# Patient Record
Sex: Female | Born: 2000 | Race: White | Hispanic: No | Marital: Single | Smoking: Never smoker
Health system: Southern US, Community
[De-identification: ages and names within clinical notes are randomized; demographics above are authoritative.]

---
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
## 2020-10-04 ENCOUNTER — Emergency Department (HOSPITAL_COMMUNITY)
Admission: EM | Admit: 2020-10-04 | Discharge: 2020-10-05 | Disposition: A | Discharge status: Home / Self Care | Payer: Medicaid - Out of State | Attending: Emergency Medicine | Admitting: Emergency Medicine

## 2020-10-04 ENCOUNTER — Other Ambulatory Visit: Payer: Self-pay

## 2020-10-04 ENCOUNTER — Encounter (HOSPITAL_COMMUNITY): Payer: Self-pay

## 2020-10-04 ENCOUNTER — Emergency Department (HOSPITAL_COMMUNITY): Payer: Medicaid - Out of State

## 2020-10-04 DIAGNOSIS — R000 Tachycardia, unspecified: Secondary | ICD-10-CM | POA: Insufficient documentation

## 2020-10-04 DIAGNOSIS — U071 COVID-19: Secondary | ICD-10-CM | POA: Insufficient documentation

## 2020-10-04 DIAGNOSIS — R0602 Shortness of breath: Secondary | ICD-10-CM | POA: Diagnosis present

## 2020-10-04 LAB — CBC WITH DIFFERENTIAL/PLATELET
Abs Immature Granulocytes: 0.02 10*3/uL (ref 0.00–0.07)
Basophils Absolute: 0 10*3/uL (ref 0.0–0.1)
Basophils Relative: 0 %
Eosinophils Absolute: 0 10*3/uL (ref 0.0–0.5)
Eosinophils Relative: 1 %
HCT: 40.4 % (ref 36.0–46.0)
Hemoglobin: 13.9 g/dL (ref 12.0–15.0)
Immature Granulocytes: 1 %
Lymphocytes Relative: 6 %
Lymphs Abs: 0.2 10*3/uL — ABNORMAL LOW (ref 0.7–4.0)
MCH: 30.2 pg (ref 26.0–34.0)
MCHC: 34.4 g/dL (ref 30.0–36.0)
MCV: 87.6 fL (ref 80.0–100.0)
Monocytes Absolute: 0.4 10*3/uL (ref 0.1–1.0)
Monocytes Relative: 12 %
Neutro Abs: 2.8 10*3/uL (ref 1.7–7.7)
Neutrophils Relative %: 80 %
Platelets: 208 10*3/uL (ref 150–400)
RBC: 4.61 MIL/uL (ref 3.87–5.11)
RDW: 11.9 % (ref 11.5–15.5)
WBC: 3.4 10*3/uL — ABNORMAL LOW (ref 4.0–10.5)
nRBC: 0 % (ref 0.0–0.2)

## 2020-10-04 LAB — I-STAT BETA HCG BLOOD, ED (MC, WL, AP ONLY): I-stat hCG, quantitative: <5 m[IU]/mL (ref <5)

## 2020-10-04 MED ORDER — ACETAMINOPHEN 325 MG PO TABS
650.0000 mg | ORAL_TABLET | Freq: Once | ORAL | Status: AC | PRN
  Administered 2020-10-04: 650 mg via ORAL
  Filled 2020-10-04: qty 2

## 2020-10-04 MED ORDER — SODIUM CHLORIDE 0.9 % IV BOLUS
1000.0000 mL | Freq: Once | INTRAVENOUS | Status: AC
  Administered 2020-10-04: 1000 mL via INTRAVENOUS

## 2020-10-04 NOTE — ED Provider Notes (Signed)
[facility]-EMERGENCY DEPT Provider Note   CSN: [id number] Arrival date & time: 10/04/20  2128     History Chief Complaint  Patient presents with  . Shortness of Breath    Marcia Steele is a 20 y.o. female.  Patient with history of migraine presents with frontal headache and mild congestion for the past 3 days, onset fever, SOB, and pleuritic chest pain today with minimal cough. No known sick contacts, including live-in boyfriend. No history of asthma. She reports nausea without vomiting. No diarrhea, abdominal pain, or urinary symptoms. She is not COVID vaccinated.   The history is provided by the patient. No language interpreter was used.  Shortness of Breath Associated symptoms: chest pain, cough (Minimal), fever and headaches   Associated symptoms: no abdominal pain, no rash, no sore throat and no vomiting        History reviewed. No pertinent past medical history.  There are no problems to display for this patient.   History reviewed. No pertinent surgical history.   OB History   No obstetric history on file.     No family history on file.  Social History   Tobacco Use  . Smoking status: Never Smoker  . Smokeless tobacco: Never Used  Substance Use Topics  . Alcohol use: Never  . Drug use: Never    Home Medications Prior to Admission medications   Not on File    Allergies    Patient has no known allergies.  Review of Systems   Review of Systems  Constitutional: Positive for chills and fever.  HENT: Positive for congestion. Negative for sore throat.   Respiratory: Positive for cough (Minimal) and shortness of breath.   Cardiovascular: Positive for chest pain. Negative for leg swelling.  Gastrointestinal: Positive for nausea. Negative for abdominal pain, diarrhea and vomiting.  Genitourinary: Positive for decreased urine volume. Negative for dysuria, flank pain and hematuria.  Musculoskeletal: Positive for myalgias.  Skin:  Negative for rash.  Neurological: Positive for headaches. Negative for weakness and light-headedness.    Physical Exam Updated Vital Signs BP 135/85   Pulse (!) 134   Temp (!) 101.5 F (38.6 C) (Oral)   Resp (!) 23   SpO2 98%   Physical Exam Vitals and nursing note reviewed.  Constitutional:      Appearance: She is well-developed and well-nourished.  HENT:     Head: Normocephalic.  Cardiovascular:     Rate and Rhythm: Regular rhythm. Tachycardia present.     Heart sounds: No murmur heard.   Pulmonary:     Effort: Pulmonary effort is normal.     Breath sounds: Normal breath sounds. No wheezing, rhonchi or rales.  Abdominal:     General: Bowel sounds are normal.     Palpations: Abdomen is soft.     Tenderness: There is no abdominal tenderness. There is no guarding or rebound.  Musculoskeletal:        General: No tenderness. Normal range of motion.     Cervical back: Normal range of motion and neck supple.     Right lower leg: No edema.     Left lower leg: No edema.  Skin:    General: Skin is warm and dry.     Findings: No rash.  Neurological:     General: No focal deficit present.     Mental Status: She is alert and oriented to person, place, and time.  Psychiatric:        Mood and Affect: Mood  and affect normal.     ED Results / Procedures / Treatments   Labs (all labs ordered are listed, but only abnormal results are displayed) Labs Reviewed  RESP PANEL BY RT-PCR (FLU A&B, COVID) ARPGX2  CBC WITH DIFFERENTIAL/PLATELET  COMPREHENSIVE METABOLIC PANEL  I-STAT BETA HCG BLOOD, ED ([facility], [facility], [facility] ONLY)    EKG None  Radiology No results found.  Procedures Procedures (including critical care time)  Medications Ordered in ED Medications  sodium chloride 0.9 % bolus 1,000 mL (has no administration in time range)  acetaminophen (TYLENOL) tablet 650 mg (650 mg Oral Given 10/04/20 2142)    ED Course  I have reviewed the triage vital signs and the nursing  notes.  Pertinent labs & imaging results that were available during my care of the patient were reviewed by me and considered in my medical decision making (see chart for details).    MDM Rules/Calculators/A&P                          Patient to ED with ss/sxs as per HPI.   She is in NAD, non-labored breathing. Tachycardic, febrile. Labs, CXR, IVF's pending.   CXR clear. She is COVID positive. Tachycardia felt secondary to fever possible element of dehydration - will recheck after fluids and defervescence.   On recheck, heart rate had improved to 107 with fluids. Albuterol given for mild SOB, with improvement to symptoms and expected reflex tachycardia to 120. BP decreased to 92 systolic. Will give another liter of fluids.   VS improved after fluids. O2 sat 100%. She can be discharged home with strict return precautions. Albuterol inhaler provided for as needed use. Will provide information to [facility] infusion clinic.    Final Clinical Impression(s) / ED Diagnoses Final diagnoses:  None   1. COVID-19  Rx / DC Orders ED Discharge Orders    None       Elpidio Anis, PA-C 10/05/20 0253    Rozelle Logan, DO 10/07/20 1840

## 2020-10-04 NOTE — ED Triage Notes (Signed)
Pt sts shob beginning last night and getting worse today.

## 2020-10-05 LAB — RESP PANEL BY RT-PCR (FLU A&B, COVID) ARPGX2
Influenza A by PCR: NEGATIVE
Influenza B by PCR: NEGATIVE
SARS Coronavirus 2 by RT PCR: POSITIVE — AB

## 2020-10-05 LAB — COMPREHENSIVE METABOLIC PANEL
ALT: 17 U/L (ref 0–44)
AST: 18 U/L (ref 15–41)
Albumin: 3.9 g/dL (ref 3.5–5.0)
Alkaline Phosphatase: 132 U/L — ABNORMAL HIGH (ref 38–126)
Anion gap: 9 (ref 5–15)
BUN: 9 mg/dL (ref 6–20)
CO2: 23 mmol/L (ref 22–32)
Calcium: 9 mg/dL (ref 8.9–10.3)
Chloride: 107 mmol/L (ref 98–111)
Creatinine, Ser: 0.91 mg/dL (ref 0.44–1.00)
GFR, Estimated: >60 mL/min (ref >60)
Glucose, Bld: 129 mg/dL — ABNORMAL HIGH (ref 70–99)
Potassium: 3.8 mmol/L (ref 3.5–5.1)
Sodium: 139 mmol/L (ref 135–145)
Total Bilirubin: 0.4 mg/dL (ref 0.3–1.2)
Total Protein: 6.7 g/dL (ref 6.5–8.1)

## 2020-10-05 MED ORDER — SODIUM CHLORIDE 0.9 % IV BOLUS
1000.0000 mL | Freq: Once | INTRAVENOUS | Status: AC
  Administered 2020-10-05: 1000 mL via INTRAVENOUS

## 2020-10-05 MED ORDER — ALBUTEROL SULFATE HFA 108 (90 BASE) MCG/ACT IN AERS
2.0000 | INHALATION_SPRAY | Freq: Once | RESPIRATORY_TRACT | Status: AC
  Administered 2020-10-05: 2 via RESPIRATORY_TRACT
  Filled 2020-10-05: qty 6.7

## 2020-10-05 MED ORDER — IBUPROFEN 200 MG PO TABS
600.0000 mg | ORAL_TABLET | Freq: Once | ORAL | Status: AC
  Administered 2020-10-05: 600 mg via ORAL
  Filled 2020-10-05: qty 3

## 2020-10-05 NOTE — Discharge Instructions (Signed)
Use the Albuterol inhaler 2-8 puffs every 4 hours. If needed more than every 2-3 hours, that is an indication to return to the emergency department for re-evaluation.   Tylenol and/or ibuprofen for any fever. Drink lots of fluid to avoid dehydration.

## 2020-10-08 ENCOUNTER — Telehealth: Payer: 59 | Admitting: Physician Assistant

## 2020-10-08 DIAGNOSIS — Z20822 Contact with and (suspected) exposure to covid-19: Secondary | ICD-10-CM

## 2020-10-08 DIAGNOSIS — J029 Acute pharyngitis, unspecified: Secondary | ICD-10-CM

## 2020-10-08 MED ORDER — NAPROXEN 500 MG PO TABS: 500.0000 mg | ORAL_TABLET | Freq: Two times a day (BID) | ORAL | 0 refills | Status: AC

## 2020-10-08 MED ORDER — ALBUTEROL SULFATE HFA 108 (90 BASE) MCG/ACT IN AERS: 2.0000 | INHALATION_SPRAY | RESPIRATORY_TRACT | 0 refills | Status: AC | PRN

## 2020-10-08 NOTE — Progress Notes (Signed)
E-Visit for Corona Virus Screening  We are sorry you are not feeling well. We are here to help!  You have tested positive for COVID-19, meaning that you were infected with the novel coronavirus and could give the virus to others.  It is vitally important that you stay home so you do not spread it to others.      Please continue isolation at home, for at least 10 days since the start of your symptoms and until you have had 24 hours with no fever (without taking a fever reducer) and with improving of symptoms.  If you have no symptoms but tested positive (or all symptoms resolve after 5 days and you have no fever) you can leave your house but continue to wear a mask around others for an additional 5 days. If you have a fever,continue to stay home until you have had 24 hours of no fever. Most cases improve 5-10 days from onset but we have seen a small number of patients who have gotten worse after the 10 days.  Please be sure to watch for worsening symptoms and remain taking the proper precautions.   Go to the nearest hospital ED for assessment if fever/cough/breathlessness are severe or illness seems like a threat to life.    The following symptoms may appear 2-14 days after exposure: . Fever . Cough . Shortness of breath or difficulty breathing . Chills . Repeated shaking with chills . Muscle pain . Headache . Sore throat . New loss of taste or smell . Fatigue . Congestion or runny nose . Nausea or vomiting . Diarrhea  You have been enrolled in [facility] Monitoring for COVID-19. Daily you will receive a questionnaire within the MyChart website. Our COVID-19 response team will be monitoring your responses daily.  You can use medication such as A prescription inhaler called Albuterol MDI 90 mcg /actuation 2 puffs every 4 hours as needed for shortness of breath, wheezing, cough and A prescription anti-inflammatory called Naprosyn 500 mg. Take twice daily as needed for fever or body aches  for 2 weeks  You have tested positive for Covid but because you are not considered high risk you do not qualify for monoclonal antibody infusion.  Supportive care is all that is needed.   You may also take acetaminophen (Tylenol) as needed for fever.  HOME CARE: . Only take medications as instructed by your medical team. . Drink plenty of fluids and get plenty of rest. . A steam or ultrasonic humidifier can help if you have congestion.   GET HELP RIGHT AWAY IF YOU HAVE EMERGENCY WARNING SIGNS.  Call 911 or proceed to your closest emergency facility if: . You develop worsening high fever. . Trouble breathing . Bluish lips or face . Persistent pain or pressure in the chest . New confusion . Inability to wake or stay awake . You cough up blood. . Your symptoms become more severe . Inability to hold down food or fluids  This list is not all possible symptoms. Contact your medical provider for any symptoms that are severe or concerning to you.    Your e-visit answers were reviewed by a board certified advanced clinical practitioner to complete your personal care plan.  Depending on the condition, your plan could have included both over the counter or prescription medications.  If there is a problem please reply once you have received a response from your provider.  Your safety is important to [place].  If you have drug allergies check  your prescription carefully.    You can use MyChart to ask questions about today's visit, request a non-urgent call back, or ask for a work or school excuse for 24 hours related to this e-Visit. If it has been greater than 24 hours you will need to follow up with your provider, or enter a new e-Visit to address those concerns. You will get an e-mail in the next two days asking about your experience.  I hope that your e-visit has been valuable and will speed your recovery. Thank you for using e-visits.      Greater than 5 minutes, yet less than 10 minutes of  time have been spent researching, coordinating and implementing care for this patient today.

## 2021-05-22 IMAGING — DX DG CHEST 1V PORT
1 series · 1 of 1 positions shown · non-contrast
Comparison: None.

CLINICAL DATA: Progressive shortness of breath.

EXAM:
PORTABLE CHEST 1 VIEW

[chest ap]
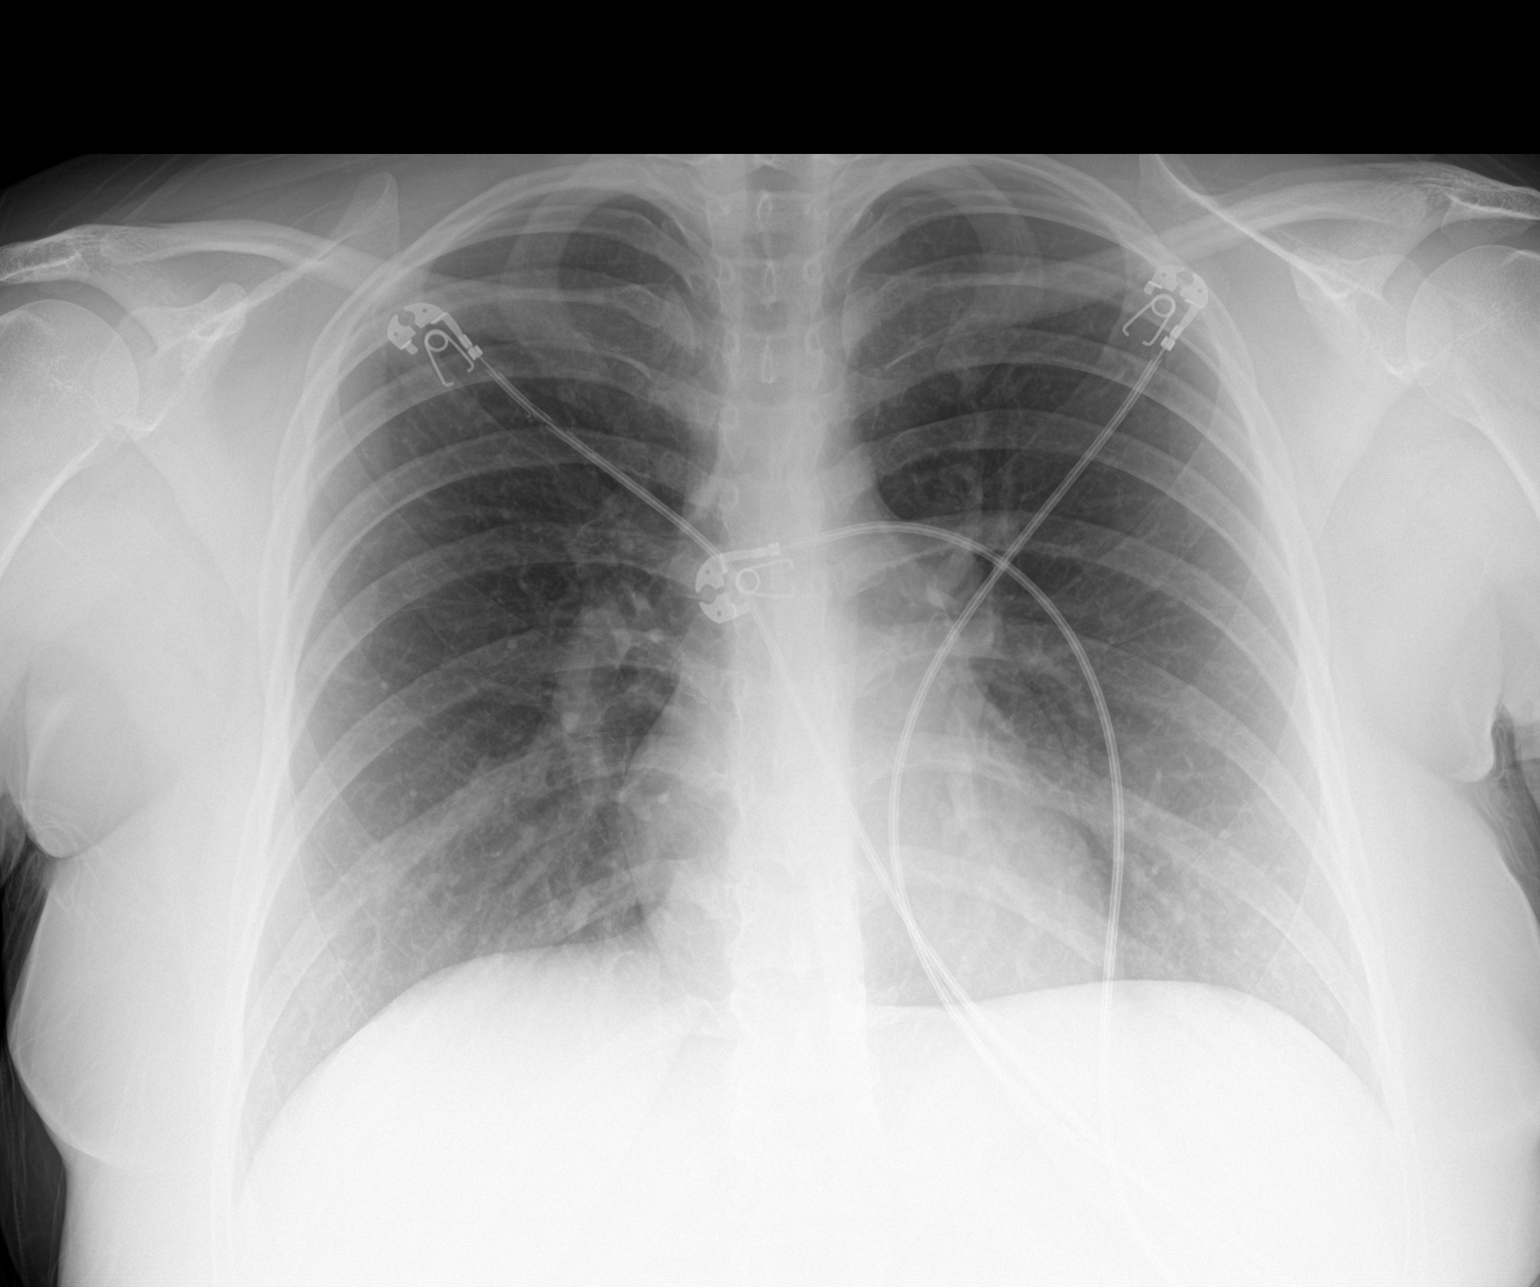

[1 of 1 positions shown; findings below may reference images not displayed]

FINDINGS: The cardiomediastinal contours are normal. The lungs are clear.
Pulmonary vasculature is normal. No consolidation, pleural effusion,
or pneumothorax. No acute osseous abnormalities are seen.
IMPRESSION: Negative portable AP view of the chest.
